# Patient Record
Sex: Male | Born: 2000 | Race: Black or African American | Hispanic: No | Marital: Single | Smoking: Never smoker
Health system: Southern US, Community
[De-identification: ages and names within clinical notes are randomized; demographics above are authoritative.]

## PROBLEM LIST (undated history)

## (undated) ENCOUNTER — Emergency Department (HOSPITAL_BASED_OUTPATIENT_CLINIC_OR_DEPARTMENT_OTHER): Disposition: A | Discharge status: Home Health | Payer: BLUE CROSS/BLUE SHIELD

## (undated) DIAGNOSIS — L0591 Pilonidal cyst without abscess: Secondary | ICD-10-CM

---
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
## 2018-04-27 ENCOUNTER — Encounter (HOSPITAL_COMMUNITY): Payer: Self-pay | Admitting: Emergency Medicine

## 2018-04-27 ENCOUNTER — Emergency Department (HOSPITAL_COMMUNITY)
Admission: EM | Admit: 2018-04-27 | Discharge: 2018-04-27 | Disposition: A | Discharge status: Home / Self Care | Payer: BLUE CROSS/BLUE SHIELD | Attending: Emergency Medicine | Admitting: Emergency Medicine

## 2018-04-27 DIAGNOSIS — L0231 Cutaneous abscess of buttock: Secondary | ICD-10-CM | POA: Diagnosis present

## 2018-04-27 MED ORDER — ACETAMINOPHEN 325 MG PO TABS
650.0000 mg | ORAL_TABLET | Freq: Once | ORAL | Status: AC
  Administered 2018-04-27: 650 mg via ORAL
  Filled 2018-04-27: qty 2

## 2018-04-27 MED ORDER — LIDOCAINE-EPINEPHRINE (PF) 2 %-1:200000 IJ SOLN
20.0000 mL | Freq: Once | INTRAMUSCULAR | Status: AC
  Administered 2018-04-27: 20 mL via INTRADERMAL
  Filled 2018-04-27: qty 20

## 2018-04-27 NOTE — ED Triage Notes (Signed)
Pt presents to ED for assessment of pilonidal cyst, which he has been seen for in the past.  States he completed Abx and there was improvement, so he did not follow up with the general surgeon.  Back today due to increased swelling, pain, and fevers at home.

## 2018-04-27 NOTE — Discharge Instructions (Signed)
You can take Tylenol or ibuprofen for pain Keep wound clean with warm soap and water and keep bandage dry, do not submerge in water for 24 hours. Change bandage sooner if it gets dirty Return for ongoing fevers, increased redness, swelling, pain, or worsening drainage

## 2018-04-27 NOTE — ED Provider Notes (Signed)
Patient placed in Quick Look pathway, seen and evaluated   Chief Complaint: abscess  HPI: Vance Hochmuth is a 17 y.o. male who presents to the ED with c/o abscess. Patient reports he had same problem 3 or 4 months ago and had the area drained, took antibiotics and was given a referral but it was better so he did not make an appointment. Patient reports today he felt like he had fever and the pain got so bad he felt nauseated.   ROS: Skin: abscess  Physical Exam:  BP 124/73   Pulse (!) 108   Temp 100.1 F (37.8 C) (Oral)   Resp 18   SpO2 100%    Gen: No distress, low grade fever  Neuro: Awake and Alert  Resp: no distress  Heart: tachycardia  Initiation of care has begun. The patient has been counseled on the process, plan, and necessity for staying for the completion/evaluation, and the remainder of the medical screening examination    Janne Napoleon, NP 04/27/18 1650    Raeford Razor, MD 04/28/18 1721

## 2018-04-27 NOTE — ED Provider Notes (Signed)
[facility] EMERGENCY DEPARTMENT Provider Note   CSN: [id number] Arrival date & time: 04/27/18  1628     History   Chief Complaint Chief Complaint  Patient presents with  . Abscess    HPI Wesley Dorsey is a 17 y.o. male who presents with a buttocks abscess. PMH significant for pilonidal cyst. He states that he's had a gradually worsening area of redness and swelling over the left buttock for the past 5-6 days. It has not been draining and has been getting worse so he decided to come to the ED. He required I&D of the pilonidal cyst last time he was in the ED. He reports having a fever today. No vomiting but feels nauseous. He is having normal BM which are not painful.  HPI  History reviewed. No pertinent past medical history.  There are no active problems to display for this patient.   History reviewed. No pertinent surgical history.      Home Medications    Prior to Admission medications   Medication Sig Start Date End Date Taking? Authorizing Provider  HYDROcodone-acetaminophen (NORCO) 5-325 MG tablet Take 1 tablet by mouth every 6 (six) hours as needed for severe pain. 12/23/17   Street, [place], PA-C  naproxen (NAPROSYN) 500 MG tablet Take 1 tablet (500 mg total) by mouth 2 (two) times daily as needed for mild pain, moderate pain or headache (TAKE WITH MEALS.). 12/23/17   Street, [place], PA-C  ondansetron (ZOFRAN ODT) 4 MG disintegrating tablet Take 1 tablet (4 mg total) by mouth every 8 (eight) hours as needed for nausea or vomiting. 12/23/17   Street, [place], PA-C    Family History History reviewed. No pertinent family history.  Social History Social History   Tobacco Use  . Smoking status: Never Smoker  . Smokeless tobacco: Never Used  Substance Use Topics  . Alcohol use: Never    Frequency: Never  . Drug use: Never     Allergies   Patient has no known allergies.   Review of Systems Review of Systems   Physical Exam Updated  Vital Signs BP 124/73   Pulse (!) 108   Temp 100.1 F (37.8 C) (Oral)   Resp 18   SpO2 100%   Physical Exam  Constitutional: He is oriented to person, place, and time. He appears well-developed and well-nourished. No distress.  HENT:  Head: Normocephalic and atraumatic.  Eyes: Pupils are equal, round, and reactive to light. Conjunctivae are normal. Right eye exhibits no discharge. Left eye exhibits no discharge. No scleral icterus.  Neck: Normal range of motion.  Cardiovascular: Normal rate.  Pulmonary/Chest: Effort normal. No respiratory distress.  Abdominal: He exhibits no distension.  Genitourinary:  Genitourinary Comments: 3x3cm abscess over the left buttock. No communication with rectum  Neurological: He is alert and oriented to person, place, and time.  Skin: Skin is warm and dry.  Psychiatric: He has a normal mood and affect. His behavior is normal.  Nursing note and vitals reviewed.    ED Treatments / Results  Labs (all labs ordered are listed, but only abnormal results are displayed) Labs Reviewed - No data to display  EKG None  Radiology No results found.  Procedures .Marland KitchenIncision and Drainage Date/Time: 04/27/2018 8:54 PM Performed by: Bethel Born, PA-C Authorized by: Bethel Born, PA-C   Consent:    Consent obtained:  Verbal   Consent given by:  Patient   Risks discussed:  Bleeding, incomplete drainage and pain   Alternatives discussed:  No treatment Location:    Type:  Abscess   Size:  3x3cm   Location:  Anogenital   Anogenital location: left buttocks. Pre-procedure details:    Skin preparation:  Betadine Anesthesia (see MAR for exact dosages):    Anesthesia method:  Local infiltration   Local anesthetic:  Lidocaine 2% WITH epi Procedure type:    Complexity:  Simple Procedure details:    Incision types:  Single straight   Incision depth:  Dermal   Scalpel blade:  11   Wound management:  Probed and deloculated and irrigated with  saline   Drainage:  Purulent   Drainage amount:  Copious   Wound treatment:  Wound left open   Packing materials:  None Post-procedure details:    Patient tolerance of procedure:  Tolerated well, no immediate complications   (including critical care time)    Medications Ordered in ED Medications  lidocaine-EPINEPHrine (XYLOCAINE W/EPI) 2 %-1:200000 (PF) injection 20 mL (has no administration in time range)     Initial Impression / Assessment and Plan / ED Course  I have reviewed the triage vital signs and the nursing notes.  Pertinent labs & imaging results that were available during my care of the patient were reviewed by me and considered in my medical decision making (see chart for details).  17 year old presents with a simple abscess which is amenable to I&D. I&D performed and patient tolerated well. Pt has elevated temp and HR. This improved on recheck after Tylenol. Discussed wound care and signs of infection (ongoing fever, chills, increasing pain, redness, or drainage at site). Strict return precautions given.  Final Clinical Impressions(s) / ED Diagnoses   Final diagnoses:  Abscess of buttock    ED Discharge Orders    None       Beryle Quant 04/27/18 2057    Donnetta Hutching, MD 04/28/18 [phone]

## 2018-04-29 ENCOUNTER — Emergency Department (HOSPITAL_BASED_OUTPATIENT_CLINIC_OR_DEPARTMENT_OTHER): Payer: BLUE CROSS/BLUE SHIELD

## 2018-04-29 ENCOUNTER — Encounter (HOSPITAL_BASED_OUTPATIENT_CLINIC_OR_DEPARTMENT_OTHER): Payer: Self-pay | Admitting: Emergency Medicine

## 2018-04-29 ENCOUNTER — Other Ambulatory Visit: Payer: Self-pay

## 2018-04-29 ENCOUNTER — Emergency Department (HOSPITAL_BASED_OUTPATIENT_CLINIC_OR_DEPARTMENT_OTHER)
Admission: EM | Admit: 2018-04-29 | Discharge: 2018-04-29 | Disposition: A | Discharge status: Home / Self Care | Payer: BLUE CROSS/BLUE SHIELD | Attending: Emergency Medicine | Admitting: Emergency Medicine

## 2018-04-29 DIAGNOSIS — Y929 Unspecified place or not applicable: Secondary | ICD-10-CM | POA: Diagnosis not present

## 2018-04-29 DIAGNOSIS — S0990XA Unspecified injury of head, initial encounter: Secondary | ICD-10-CM | POA: Diagnosis present

## 2018-04-29 DIAGNOSIS — S0181XA Laceration without foreign body of other part of head, initial encounter: Secondary | ICD-10-CM | POA: Diagnosis not present

## 2018-04-29 DIAGNOSIS — R42 Dizziness and giddiness: Secondary | ICD-10-CM | POA: Diagnosis not present

## 2018-04-29 DIAGNOSIS — Y9389 Activity, other specified: Secondary | ICD-10-CM | POA: Diagnosis not present

## 2018-04-29 DIAGNOSIS — Y998 Other external cause status: Secondary | ICD-10-CM | POA: Diagnosis not present

## 2018-04-29 DIAGNOSIS — R51 Headache: Secondary | ICD-10-CM | POA: Diagnosis not present

## 2018-04-29 MED ORDER — LIDOCAINE-EPINEPHRINE (PF) 2 %-1:200000 IJ SOLN
10.0000 mL | Freq: Once | INTRAMUSCULAR | Status: AC
  Administered 2018-04-29: 10 mL
  Filled 2018-04-29 (×2): qty 10

## 2018-04-29 MED ORDER — CEPHALEXIN 500 MG PO CAPS: 500.0000 mg | ORAL_CAPSULE | Freq: Three times a day (TID) | ORAL | 0 refills | Status: AC
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
## 2018-04-29 NOTE — ED Notes (Signed)
Patient transported to CT 

## 2018-04-29 NOTE — Discharge Instructions (Addendum)
Please read instructions below. Keep your wound clean and covered. In 24 hours, you can get your wound wet; gently clean it with soap and water, pat it dry, and reapply a clean bandage. Take the antibiotic as prescribed until it is gone. You can take ibuprofen/advil every 6 hours as needed for pain Limit screen time and complex thinking for the next week.  Avoid any contact sports/activities, until cleared by your primary care provider. Follow up with your primary care or urgent care for wound recheck and suture removal in 5 days.  Return to the ER for fever, pus draining from wound, redness, severely worsening headache, vision changes, vomiting, or new or worsening symptoms.
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
## 2018-08-03 ENCOUNTER — Emergency Department (HOSPITAL_BASED_OUTPATIENT_CLINIC_OR_DEPARTMENT_OTHER)
Admission: EM | Admit: 2018-08-03 | Discharge: 2018-08-03 | Disposition: A | Discharge status: Home / Self Care | Payer: BLUE CROSS/BLUE SHIELD | Attending: Emergency Medicine | Admitting: Emergency Medicine

## 2018-08-03 ENCOUNTER — Other Ambulatory Visit: Payer: Self-pay

## 2018-08-03 ENCOUNTER — Encounter (HOSPITAL_BASED_OUTPATIENT_CLINIC_OR_DEPARTMENT_OTHER): Payer: Self-pay | Admitting: *Deleted

## 2018-08-03 ENCOUNTER — Emergency Department (HOSPITAL_BASED_OUTPATIENT_CLINIC_OR_DEPARTMENT_OTHER): Payer: BLUE CROSS/BLUE SHIELD

## 2018-08-03 DIAGNOSIS — M25561 Pain in right knee: Secondary | ICD-10-CM | POA: Diagnosis present

## 2018-08-03 NOTE — Discharge Instructions (Signed)
Your workup was reassuring today that you do not have a bony injury or dislocation to your knee.  We recommend that you use the provided Ace wrap and crutches as needed, but you can continue to bear weight as tolerated.  Read through the included information about routine care for injuries.  Follow up as recommended if you are still having problems in about a week.   

## 2018-08-03 NOTE — ED Provider Notes (Signed)
Emergency Department Provider Note   I have reviewed the triage vital signs and the nursing notes.   HISTORY  Chief Complaint Knee Injury   HPI Wesley Dorsey is a 18 y.o. male presents to the emergency department for evaluation of right knee pain.  Patient has had 2 days of discomfort.  He suspects that he injured the knee during a basketball game.  He states he was playing when he feel like his knee bent backwards causing him some discomfort.  He states he was able to "walk it off" and kept playing.  Later in the game he felt like his knee bent to the side.  Denies any twisting.  No pain in the ankle or hip.  He has been ambulatory since the injury.  He has a knee sleeve in place but continues to have discomfort so came to the emergency department.  No numbness or tingling.  History reviewed. No pertinent past medical history.  There are no active problems to display for this patient.   History reviewed. No pertinent surgical history.  Current Outpatient Rx  . Order #: [id number] Class: Print    Allergies Patient has no known allergies.  History reviewed. No pertinent family history.  Social History Social History   Tobacco Use  . Smoking status: Never Smoker  . Smokeless tobacco: Never Used  Substance Use Topics  . Alcohol use: Never    Frequency: Never  . Drug use: Never    Review of Systems  Constitutional: No fever/chills Genitourinary: Negative for dysuria. Musculoskeletal: Negative for back pain. Positive right knee pain.  Skin: Negative for rash. Neurological: Negative for headaches.  10-point ROS otherwise negative.  ____________________________________________   PHYSICAL EXAM:  VITAL SIGNS: ED Triage Vitals  Enc Vitals Group     BP 08/03/18 1801 118/61     Pulse Rate 08/03/18 1801 85     Resp 08/03/18 1801 18     Temp 08/03/18 1801 98.5 F (36.9 C)     Temp Source 08/03/18 1801 Oral     SpO2 08/03/18 1801 100 %     Weight 08/03/18 1801  205 lb (93 kg)     Height 08/03/18 1801 5\' 10"  (1.778 m)     Pain Score 08/03/18 1800 7   Constitutional: Alert and oriented. Well appearing and in no acute distress. Eyes: Conjunctivae are normal.  Head: Atraumatic. Nose: No congestion/rhinnorhea. Mouth/Throat: Mucous membranes are moist. Neck: No stridor.  Cardiovascular: Normal rate, regular rhythm.  Respiratory: Normal respiratory effort. Gastrointestinal: No distention.  Musculoskeletal: Mild pain with passive ROM of the right knee. No ankle tenderness. No significant knee swelling. No laceration. No joint laxity.  Neurologic:  Normal speech and language.  Skin:  Skin is warm, dry and intact. No rash noted.  ____________________________________________  RADIOLOGY  Dg Knee Complete 4 Views Right  Result Date: 08/03/2018 CLINICAL DATA:  Medial right knee pain following a twisting injury 2 days ago. EXAM: RIGHT KNEE - COMPLETE 4+ VIEW COMPARISON:  None. FINDINGS: Moderate-sized effusion. Normal appearing bones with no fracture or dislocation seen. IMPRESSION: Moderate-sized effusion.  No fracture or dislocation seen. Electronically Signed   By: Beckie Salts M.D.   On: 08/03/2018 18:33    ____________________________________________   PROCEDURES  Procedure(s) performed:   Procedures  None ____________________________________________   INITIAL IMPRESSION / ASSESSMENT AND PLAN / ED COURSE  Pertinent labs & imaging results that were available during my care of the patient were reviewed by me and considered in my medical  decision making (see chart for details).  She presents to the emergency department for evaluation of right knee pain after injury during basketball 2 days ago.  He has been doing RICE at home but continue to have pain with walking.  No concern for knee dislocation at the time of injury based on history.  Patient with normal range of motion of the knee and no joint laxity.   Moderate effusion on x-ray.  No  bony abnormality.  Patient reports having crutches fitted to him at home.  He will use that along with the compressive knee sleeve.  Provided information on management of symptoms at home and he will follow-up with sports medicine in the coming 1-2 weeks if symptoms persist.  ____________________________________________  FINAL CLINICAL IMPRESSION(S) / ED DIAGNOSES  Final diagnoses:  Acute pain of right knee    Note:  This document was prepared using Dragon voice recognition software and may include unintentional dictation errors.  Alona Bene, MD Emergency Medicine    Jasha Hodzic, Arlyss Repress, MD 08/03/18 [phone]

## 2018-08-03 NOTE — ED Triage Notes (Signed)
Pt c/o right knee injury while playing ball x 2 days ago

## 2018-08-03 NOTE — ED Notes (Signed)
Pt took aspirin, ibuprofen, and tylenol approx 11am.

## 2018-08-09 ENCOUNTER — Ambulatory Visit: Payer: BLUE CROSS/BLUE SHIELD | Admitting: Family Medicine

## 2018-08-15 ENCOUNTER — Ambulatory Visit: Payer: BLUE CROSS/BLUE SHIELD | Admitting: Family Medicine

## 2018-10-17 ENCOUNTER — Emergency Department (HOSPITAL_COMMUNITY)
Admission: EM | Admit: 2018-10-17 | Discharge: 2018-10-17 | Disposition: A | Discharge status: Home / Self Care | Payer: BLUE CROSS/BLUE SHIELD | Attending: Emergency Medicine | Admitting: Emergency Medicine

## 2018-10-17 ENCOUNTER — Other Ambulatory Visit: Payer: Self-pay

## 2018-10-17 ENCOUNTER — Encounter (HOSPITAL_COMMUNITY): Payer: Self-pay | Admitting: Emergency Medicine

## 2018-10-17 DIAGNOSIS — L0501 Pilonidal cyst with abscess: Secondary | ICD-10-CM | POA: Insufficient documentation

## 2018-10-17 DIAGNOSIS — L0291 Cutaneous abscess, unspecified: Secondary | ICD-10-CM

## 2018-10-17 MED ORDER — SULFAMETHOXAZOLE-TRIMETHOPRIM 800-160 MG PO TABS: 1.0000 | ORAL_TABLET | Freq: Two times a day (BID) | ORAL | 0 refills | Status: DC

## 2018-10-17 MED ORDER — LIDOCAINE HCL (PF) 1 % IJ SOLN
5.0000 mL | Freq: Once | INTRAMUSCULAR | Status: AC
  Administered 2018-10-17: 10:00:00 5 mL
  Filled 2018-10-17: qty 5

## 2018-10-17 NOTE — ED Provider Notes (Signed)
[facility] EMERGENCY DEPARTMENT Provider Note   CSN: [id number] Arrival date & time: 10/17/18  4431    History   Chief Complaint Chief Complaint  Patient presents with  . Abscess    HPI Wesley Dorsey is a 18 y.o. male.     18yo male with history of pilonidal abscess with previous I&D presents with complaint of abscess x 3 days (pain, no drainage), no home treatment. Denies fevers, abdominal pain, changes in bowel or bladder habits. No other complaints or concerns.      History reviewed. No pertinent past medical history.  There are no active problems to display for this patient.   History reviewed. No pertinent surgical history.      Home Medications    Prior to Admission medications   Medication Sig Start Date End Date Taking? Authorizing Provider  naproxen (NAPROSYN) 500 MG tablet Take 1 tablet (500 mg total) by mouth 2 (two) times daily as needed for mild pain, moderate pain or headache (TAKE WITH MEALS.). 12/23/17   Street, Mercedes, PA-C  sulfamethoxazole-trimethoprim (BACTRIM DS,SEPTRA DS) 800-160 MG tablet Take 1 tablet by mouth 2 (two) times daily for 10 days. 10/17/18 10/27/18  Jeannie Fend, PA-C    Family History No family history on file.  Social History Social History   Tobacco Use  . Smoking status: Never Smoker  . Smokeless tobacco: Never Used  Substance Use Topics  . Alcohol use: Never    Frequency: Never  . Drug use: Never     Allergies   Patient has no known allergies.   Review of Systems Review of Systems  Constitutional: Negative for fever.  Gastrointestinal: Negative for rectal pain.  Musculoskeletal: Negative for back pain.  Skin: Negative for wound.  Allergic/Immunologic: Negative for immunocompromised state.  Hematological: Negative for adenopathy.  All other systems reviewed and are negative.    Physical Exam Updated Vital Signs BP 105/60 (BP Location: Left Arm)   Pulse 83   Temp 98.6 F (37 C)  (Oral)   Resp 16   Ht 5\' 10"  (1.778 m)   Wt 93 kg   SpO2 100%   BMI 29.41 kg/m   Physical Exam Vitals signs and nursing note reviewed.  Constitutional:      General: He is not in acute distress.    Appearance: He is well-developed. He is not diaphoretic.  HENT:     Head: Normocephalic and atraumatic.  Pulmonary:     Effort: Pulmonary effort is normal.  Skin:    General: Skin is warm and dry.     Findings: Erythema present.       Neurological:     Mental Status: He is alert and oriented to person, place, and time.  Psychiatric:        Behavior: Behavior normal.      ED Treatments / Results  Labs (all labs ordered are listed, but only abnormal results are displayed) Labs Reviewed - No data to display  EKG None  Radiology No results found.  Procedures Procedures (including critical care time)  EMERGENCY DEPARTMENT US SOFT TISSUE INTERPRETATION "Study: Limited Soft Tissue Ultrasound"  INDICATIONS: Soft tissue infection Multiple views of the body part were obtained in real-time with a multi-frequency linear probe  PERFORMED BY: Myself IMAGES ARCHIVED?: Yes SIDE:Midline BODY PART:buttock INTERPRETATION:  Abcess present       Medications Ordered in ED Medications  lidocaine (PF) (XYLOCAINE) 1 % injection 5 mL (5 mLs Infiltration Given 10/17/18 1011)  Initial Impression / Assessment and Plan / ED Course  I have reviewed the triage vital signs and the nursing notes.  Pertinent labs & imaging results that were available during my care of the patient were reviewed by me and considered in my medical decision making (see chart for details).  Clinical Course as of Oct 17 1054  Wed Oct 17, 2018  1032 18yo male with previous pilonidal abscess present with concern for return of abscess. No fevers, no drainage. On exam, small area of possible abscess, US used to evaluate area and shows likely abscess. I&D completed, bloody, nonpurulent drainage present.  Packing was not placed. Recommend abx and warm compresses, recheck in ER or UC in 2 days. Patient is agreeable with plan of care, verbalizes understanding. Dc with rx for Bactrim.   Wesley Dorsey was evaluated in Emergency Department on 10/17/2018 for the symptoms described in the history of present illness. He was evaluated in the context of the global COVID-19 pandemic, which necessitated consideration that the patient might be at risk for infection with the SARS-CoV-2 virus that causes COVID-19. Institutional protocols and algorithms that pertain to the evaluation of patients at risk for COVID-19 are in a state of rapid change based on information released by regulatory bodies including the CDC and federal and state organizations. These policies and algorithms were followed during the patient's care in the ED.     [LM]    Clinical Course User Index [LM] Jeannie FendMurphy,  A, PA-C      Final Clinical Impressions(s) / ED Diagnoses   Final diagnoses:  Abscess    ED Discharge Orders         Ordered    sulfamethoxazole-trimethoprim (BACTRIM DS,SEPTRA DS) 800-160 MG tablet  2 times daily     10/17/18 1029           Alden HippMurphy,  A, PA-C 10/17/18 1059    Vanetta MuldersZackowski, Scott, MD 10/20/18 1342

## 2018-10-17 NOTE — Discharge Instructions (Addendum)
Take antibiotics as prescribed and complete the full course. Return to the ER or go to UC for any concerns at any time.

## 2018-10-17 NOTE — ED Triage Notes (Signed)
Pt. Stated, I have an abscess right on the crack of my butt for 3 days.

## 2018-10-17 NOTE — ED Provider Notes (Signed)
..  Incision and Drainage Date/Time: 10/17/2018 10:46 AM Performed by: Tanda Rockers, PA-C Authorized by: Tanda Rockers, PA-C   Consent:    Consent obtained:  Verbal   Consent given by:  Patient   Risks discussed:  Bleeding, incomplete drainage, pain and infection Location:    Type:  Abscess   Size:  2 cm   Location:  Anogenital   Anogenital location:  Pilonidal Pre-procedure details:    Skin preparation:  Betadine Anesthesia (see MAR for exact dosages):    Anesthesia method:  Local infiltration   Local anesthetic:  Lidocaine 1% w/o epi Procedure type:    Complexity:  Simple Procedure details:    Needle aspiration: no     Incision types:  Single straight   Incision depth:  Submucosal   Scalpel blade:  10   Wound management:  Probed and deloculated   Drainage:  Bloody   Drainage amount:  Scant   Wound treatment:  Wound left open   Packing materials:  None Post-procedure details:    Patient tolerance of procedure:  Tolerated well, no immediate complications      Tanda Rockers, PA-C 10/17/18 1048    Sabas Sous, MD 10/17/18 1534

## 2018-10-17 NOTE — ED Notes (Signed)
Patient verbalizes understanding of discharge instructions. Opportunity for questioning and answers were provided. Armband removed by staff, pt discharged from ED. Prescription and pharmacy reviewed. Pt ambulatory to lobby.

## 2018-10-19 ENCOUNTER — Other Ambulatory Visit: Payer: Self-pay

## 2018-10-19 ENCOUNTER — Encounter (HOSPITAL_BASED_OUTPATIENT_CLINIC_OR_DEPARTMENT_OTHER): Payer: Self-pay | Admitting: Emergency Medicine

## 2018-10-19 ENCOUNTER — Emergency Department (HOSPITAL_BASED_OUTPATIENT_CLINIC_OR_DEPARTMENT_OTHER)
Admission: EM | Admit: 2018-10-19 | Discharge: 2018-10-19 | Disposition: A | Discharge status: Home / Self Care | Payer: BLUE CROSS/BLUE SHIELD | Attending: Emergency Medicine | Admitting: Emergency Medicine

## 2018-10-19 DIAGNOSIS — L0501 Pilonidal cyst with abscess: Secondary | ICD-10-CM | POA: Diagnosis not present

## 2018-10-19 DIAGNOSIS — R509 Fever, unspecified: Secondary | ICD-10-CM | POA: Diagnosis not present

## 2018-10-19 HISTORY — DX: Pilonidal cyst without abscess: L05.91

## 2018-10-19 MED ORDER — DOXYCYCLINE HYCLATE 100 MG PO CAPS: 100.0000 mg | ORAL_CAPSULE | Freq: Two times a day (BID) | ORAL | 0 refills | Status: AC

## 2018-10-19 MED ORDER — LIDOCAINE-EPINEPHRINE 2 %-1:200000 IJ SOLN
10.0000 mL | Freq: Once | INTRAMUSCULAR | Status: AC
Start: 2018-10-19 — End: 2018-10-19
  Administered 2018-10-19: 09:00:00 10 mL
  Filled 2018-10-19: qty 10

## 2018-10-19 MED ORDER — LIDOCAINE-EPINEPHRINE (PF) 2 %-1:200000 IJ SOLN
INTRAMUSCULAR | Status: AC
  Administered 2018-10-19: 10 mL
  Filled 2018-10-19: qty 10

## 2018-10-19 NOTE — Discharge Instructions (Signed)
You have been seen in the Emergency Department (ED) today for an abscess.  This was drained in the ED. ° °Please follow up with your doctor or in the ED in 24-48 hours for recheck of your wound.  Read through the additional discharge instructions included below regarding wound care recommendations.  Keep the wound clean and dry, though you may wash as you would normally.  Change the dressing twice daily. ° °Call your doctor sooner or return to the ED if you develop worsening signs of infection such as: increased redness, increased pain, pus, or fever. ° ° °Abscess °An abscess is an infected area that contains a collection of pus and debris. It can occur in almost any part of the body. An abscess is also known as a furuncle or boil. °CAUSES  °An abscess occurs when tissue gets infected. This can occur from blockage of oil or sweat glands, infection of hair follicles, or a minor injury to the skin. As the body tries to fight the infection, pus collects in the area and creates pressure under the skin. This pressure causes pain. People with weakened immune systems have difficulty fighting infections and get certain abscesses more often.  °SYMPTOMS °Usually an abscess develops on the skin and becomes a painful mass that is red, warm, and tender. If the abscess forms under the skin, you may feel a moveable soft area under the skin. Some abscesses break open (rupture) on their own, but most will continue to get worse without care. The infection can spread deeper into the body and eventually into the bloodstream, causing you to feel ill.  °DIAGNOSIS  °Your caregiver will take your medical history and perform a physical exam. A sample of fluid may also be taken from the abscess to determine what is causing your infection. °TREATMENT  °Your caregiver may prescribe antibiotic medicines to fight the infection. However, taking antibiotics alone usually does not cure an abscess. Your caregiver may need to make a small cut  (incision) in the abscess to drain the pus. In some cases, gauze is packed into the abscess to reduce pain and to continue draining the area. °HOME CARE INSTRUCTIONS  °Only take over-the-counter or prescription medicines for pain, discomfort, or fever as directed by your caregiver. °If you were prescribed antibiotics, take them as directed. Finish them even if you start to feel better. °If gauze is used, follow your caregiver's directions for changing the gauze. °To avoid spreading the infection: °Keep your draining abscess covered with a bandage. °Wash your hands well. °Do not share personal care items, towels, or whirlpools with others. °Avoid skin contact with others. °Keep your skin and clothes clean around the abscess. °Keep all follow-up appointments as directed by your caregiver. °SEEK MEDICAL CARE IF:  °You have increased pain, swelling, redness, fluid drainage, or bleeding. °You have muscle aches, chills, or a general ill feeling. °You have a fever. °MAKE SURE YOU:  °Understand these instructions. °Will watch your condition. °Will get help right away if you are not doing well or get worse. °Document Released: 03/30/2005 Document Revised: 12/20/2011 Document Reviewed: 09/02/2011 °Exit[employer]® Patient Information ©2015 [employer]. This information is not intended to replace advice given to you by your health care provider. Make sure you discuss any questions you have with your health care provider. ° °Abscess °Care After °An abscess (also called a boil or furuncle) is an infected area that contains a collection of pus. Signs and symptoms of an abscess include pain, tenderness, redness, or hardness,   or you may feel a moveable soft area under your skin. An abscess can occur anywhere in the body. The infection may spread to surrounding tissues causing cellulitis. A cut (incision) by the surgeon was made over your abscess and the pus was drained out. Gauze may have been packed into the space to provide a drain  that will allow the cavity to heal from the inside outwards. The boil may be painful for 5 to 7 days. Most people with a boil do not have high fevers. Your abscess, if seen early, may not have localized, and may not have been lanced. If not, another appointment may be required for this if it does not get better on its own or with medications. °HOME CARE INSTRUCTIONS  °Only take over-the-counter or prescription medicines for pain, discomfort, or fever as directed by your caregiver. °When you bathe, soak and then remove gauze or iodoform packs at least daily or as directed by your caregiver. You may then wash the wound gently with mild soapy water. Repack with gauze or do as your caregiver directs. °SEEK IMMEDIATE MEDICAL CARE IF:  °You develop increased pain, swelling, redness, drainage, or bleeding in the wound site. °You develop signs of generalized infection including muscle aches, chills, fever, or a general ill feeling. °An oral temperature above 102° F (38.9° C) develops, not controlled by medication. °See your caregiver for a recheck if you develop any of the symptoms described above. If medications (antibiotics) were prescribed, take them as directed. °Document Released: 01/06/2005 Document Revised: 09/12/2011 Document Reviewed: 09/03/2007 °ExitCare® Patient Information ©2015 [employer]. This information is not intended to replace advice given to you by your health care provider. Make sure you discuss any questions you have with your health care provider. ° °Cellulitis °Cellulitis is an infection of the skin and the tissue beneath it. The infected area is usually red and tender. Cellulitis occurs most often in the arms and lower legs.  °CAUSES  °Cellulitis is caused by bacteria that enter the skin through cracks or cuts in the skin. The most common types of bacteria that cause cellulitis are staphylococci and streptococci. °SIGNS AND SYMPTOMS  °Redness and warmth. °Swelling. °Tenderness or  pain. °Fever. °DIAGNOSIS  °Your health care provider can usually determine what is wrong based on a physical exam. Blood tests may also be done. °TREATMENT  °Treatment usually involves taking an antibiotic medicine. °HOME CARE INSTRUCTIONS  °Take your antibiotic medicine as directed by your health care provider. Finish the antibiotic even if you start to feel better. °Keep the infected arm or leg elevated to reduce swelling. °Apply a warm cloth to the affected area up to 4 times per day to relieve pain. °Take medicines only as directed by your health care provider. °Keep all follow-up visits as directed by your health care provider. °SEEK MEDICAL CARE IF:  °You notice red streaks coming from the infected area. °Your red area gets larger or turns dark in color. °Your bone or joint underneath the infected area becomes painful after the skin has healed. °Your infection returns in the same area or another area. °You notice a swollen bump in the infected area. °You develop new symptoms. °You have a fever. °SEEK IMMEDIATE MEDICAL CARE IF:  °You feel very sleepy. °You develop vomiting or diarrhea. °You have a general ill feeling (malaise) with muscle aches and pains. °MAKE SURE YOU:  °Understand these instructions. °Will watch your condition. °Will get help right away if you are not doing well or get   worse. °Document Released: 03/30/2005 Document Revised: 11/04/2013 Document Reviewed: 09/05/2011 °ExitCare® Patient Information ©2015 [employer]. This information is not intended to replace advice given to you by your health care provider. Make sure you discuss any questions you have with your health care provider. ° ° ° °

## 2018-10-19 NOTE — ED Triage Notes (Signed)
Hx of Pilonidal cyst and pt sts it started flaring up again 5 days ago.  Was at [facility] ED yesterday and received rx for abx.  Area is worse. Has had to have it drained in the past.

## 2018-10-19 NOTE — ED Notes (Signed)
ED Provider at bedside. 

## 2018-10-19 NOTE — ED Provider Notes (Signed)
Emergency Department Provider Note   I have reviewed the triage vital signs and the nursing notes.   HISTORY  Chief Complaint Abscess   HPI Wesley Dorsey is a 18 y.o. male with PMH of pilonidal cyst returns to the emergency department with worsening pain in the area of a recently drained abscess  with fever at home.  Patient reports T-max of 17 F yesterday.  No shaking chills, body aches.  Patient had incision and drainage at that time with scant purulent material noted on procedure note.  Patient was prescribed Bactrim and states has been taking the medication but pain is worsening.  He began to have some drainage from the area last night after soaking in the bath.  He denies any respiratory symptoms.  He has had this area drained twice before and has attempted to call the general surgery clinic for follow-up appointment and cyst removal but due to COVID-19 pandemic the office has been closed.  Patient describes the pain as constant and worse with movement or pressing in the area.   Past Medical History:  Diagnosis Date  . Pilonidal cyst     There are no active problems to display for this patient.   History reviewed. No pertinent surgical history.  Allergies Patient has no known allergies.  No family history on file.  Social History Social History   Tobacco Use  . Smoking status: Never Smoker  . Smokeless tobacco: Never Used  Substance Use Topics  . Alcohol use: Never    Frequency: Never  . Drug use: Yes    Types: Marijuana    Comment: yesterday was last use    Review of Systems  Constitutional: Positive fever, no chills.  Eyes: No visual changes. ENT: No sore throat. Cardiovascular: Denies chest pain. Respiratory: Denies shortness of breath. Gastrointestinal: No abdominal pain.  No nausea, no vomiting.  No diarrhea.  No constipation. Genitourinary: Negative for dysuria. Musculoskeletal: Negative for back pain. Skin: Abscess near buttocks.   Neurological: Negative for headaches, focal weakness or numbness.  10-point ROS otherwise negative.  ____________________________________________   PHYSICAL EXAM:  VITAL SIGNS: ED Triage Vitals  Enc Vitals Group     BP 10/19/18 0905 122/77     Pulse Rate 10/19/18 0905 89     Resp 10/19/18 0905 16     Temp 10/19/18 0905 99.3 F (37.4 C)     Temp Source 10/19/18 0905 Oral     SpO2 10/19/18 0905 99 %     Weight 10/19/18 0906 204 lb 12.9 oz (92.9 kg)     Height 10/19/18 0906 5\' 10"  (1.778 m)     Pain Score 10/19/18 0906 10   Constitutional: Alert and oriented. Well appearing and in no acute distress. Eyes: Conjunctivae are normal.  Head: Atraumatic. Nose: No congestion/rhinnorhea. Mouth/Throat: Mucous membranes are moist.  Neck: No stridor.   Cardiovascular: Normal rate, regular rhythm. Good peripheral circulation. Grossly normal heart sounds.   Respiratory: Normal respiratory effort.  No retractions. Lungs CTAB. Gastrointestinal: Soft and nontender. No distention. Pilonidal area of swelling without surrounding cellulitis or induration. Area of recent incision is closed. Inferior to the fluctuant area there is a small skin opening with copious purulent drainage.  Musculoskeletal: No lower extremity tenderness nor edema. No gross deformities of extremities. Neurologic:  Normal speech and language. No gross focal neurologic deficits are appreciated.  Skin:  Skin is warm, dry and intact. No rash noted.  ____________________________________________   PROCEDURES  Procedure(s) performed:   Marland KitchenMarland KitchenIncision and  Drainage Date/Time: 10/19/2018 9:39 AM Performed by: Maia Plan,  G, MD Authorized by: Maia Plan,  G, MD   Consent:    Consent obtained:  Verbal   Consent given by:  Patient   Risks discussed:  Bleeding, damage to other organs, infection, incomplete drainage and pain   Alternatives discussed:  No treatment Location:    Type:  Abscess   Size:  4   Location:  Anogenital    Anogenital location:  Pilonidal Pre-procedure details:    Skin preparation:  Betadine Anesthesia (see MAR for exact dosages):    Anesthesia method:  Local infiltration   Local anesthetic:  Lidocaine 2% WITH epi Procedure type:    Complexity:  Complex Procedure details:    Needle aspiration: no     Incision types:  Single straight   Incision depth:  Subcutaneous   Scalpel blade:  11   Wound management:  Probed and deloculated   Drainage:  Purulent   Drainage amount:  Copious   Packing materials:  1/4 in iodoform gauze   Amount 1/4" iodoform:  15 cm approximately  Post-procedure details:    Patient tolerance of procedure:  Tolerated well, no immediate complications     ____________________________________________   INITIAL IMPRESSION / ASSESSMENT AND PLAN / ED COURSE  Pertinent labs & imaging results that were available during my care of the patient were reviewed by me and considered in my medical decision making (see chart for details).   Patient returns with worsening pain and fever at home.  There is a fluctuant area in the upper gluteal cleft.  No tenderness near the rectum.  No overlying cellulitis.  There is an area inferior to the fluctuant mass which is spontaneously draining copious purulent material.  I was able to express a significant amount of pus through this opening but given the amount I discussed with the patient extending the opening and placing packing.  He verbally consented to the procedure.  See associated procedure note.  Patient will need to return to the emergency department in 48 hours for wound reevaluation and packing removal.  Decision to replace packing to be made at that time.  I will switch the patient to doxycycline.  Discussed ED return precautions.  Patient given information regarding outpatient surgery for follow-up and removal of the pilonidal cyst but options for that are limited in the setting of COVID-19 and office closure.     ____________________________________________  FINAL CLINICAL IMPRESSION(S) / ED DIAGNOSES  Final diagnoses:  Pilonidal abscess     MEDICATIONS GIVEN DURING THIS VISIT:  Medications  lidocaine-EPINEPHrine (XYLOCAINE W/EPI) 2 %-1:200000 (PF) injection 10 mL (10 mLs Infiltration Given 10/19/18 0918)     NEW OUTPATIENT MEDICATIONS STARTED DURING THIS VISIT:  New Prescriptions   DOXYCYCLINE (VIBRAMYCIN) 100 MG CAPSULE    Take 1 capsule (100 mg total) by mouth 2 (two) times daily for 7 days.    Note:  This document was prepared using Dragon voice recognition software and may include unintentional dictation errors.  Alona Bene , MD Emergency Medicine    , Arlyss Repress G, MD 10/19/18 [phone]

## 2018-10-21 ENCOUNTER — Encounter (HOSPITAL_BASED_OUTPATIENT_CLINIC_OR_DEPARTMENT_OTHER): Payer: Self-pay | Admitting: Emergency Medicine

## 2018-10-21 ENCOUNTER — Other Ambulatory Visit: Payer: Self-pay

## 2018-10-21 ENCOUNTER — Emergency Department (HOSPITAL_BASED_OUTPATIENT_CLINIC_OR_DEPARTMENT_OTHER)
Admission: EM | Admit: 2018-10-21 | Discharge: 2018-10-21 | Disposition: A | Discharge status: Home / Self Care | Payer: BLUE CROSS/BLUE SHIELD | Attending: Emergency Medicine | Admitting: Emergency Medicine

## 2018-10-21 DIAGNOSIS — Z5189 Encounter for other specified aftercare: Secondary | ICD-10-CM

## 2018-10-21 DIAGNOSIS — L0501 Pilonidal cyst with abscess: Secondary | ICD-10-CM | POA: Insufficient documentation

## 2018-10-21 DIAGNOSIS — Z79899 Other long term (current) drug therapy: Secondary | ICD-10-CM | POA: Diagnosis not present

## 2018-10-21 DIAGNOSIS — Z4801 Encounter for change or removal of surgical wound dressing: Secondary | ICD-10-CM | POA: Diagnosis present

## 2018-10-21 NOTE — ED Provider Notes (Signed)
[facility] EMERGENCY DEPARTMENT Provider Note   CSN: [id number] Arrival date & time: 10/21/18  1025    History   Chief Complaint Chief Complaint  Patient presents with   Wound Check    HPI Ebony HailDarren Harlin HeysRollins is a 18 y.o. male.     18 year old male presents for abscess recheck.  Patient was last seen 2 days ago, had I&D with packing placed.  Patient has been taking his antibiotics. Patient denies fevers, reports ongoing bloody drainage. No other complaints or concerns.      Past Medical History:  Diagnosis Date   Pilonidal cyst     There are no active problems to display for this patient.   History reviewed. No pertinent surgical history.      Home Medications    Prior to Admission medications   Medication Sig Start Date End Date Taking? Authorizing Provider  doxycycline (VIBRAMYCIN) 100 MG capsule Take 1 capsule (100 mg total) by mouth 2 (two) times daily for 7 days. 10/19/18 10/26/18  Long, Arlyss RepressJoshua G, MD  naproxen (NAPROSYN) 500 MG tablet Take 1 tablet (500 mg total) by mouth 2 (two) times daily as needed for mild pain, moderate pain or headache (TAKE WITH MEALS.). 12/23/17   Street, Mission HillMercedes, PA-C    Family History No family history on file.  Social History Social History   Tobacco Use   Smoking status: Never Smoker   Smokeless tobacco: Never Used  Substance Use Topics   Alcohol use: Never    Frequency: Never   Drug use: Yes    Types: Marijuana    Comment: yesterday was last use     Allergies   Patient has no known allergies.   Review of Systems Review of Systems  Constitutional: Negative for fever.  Musculoskeletal: Negative for arthralgias and myalgias.  Allergic/Immunologic: Negative for immunocompromised state.  Hematological: Negative for adenopathy. Does not bruise/bleed easily.  All other systems reviewed and are negative.    Physical Exam Updated Vital Signs BP 121/79 (BP Location: Right Arm)    Pulse 99    Temp 98.3  F (36.8 C) (Oral)    Resp 16    Ht 5\' 10"  (1.778 m)    Wt 93 kg    SpO2 100%    BMI 29.41 kg/m   Physical Exam Vitals signs and nursing note reviewed.  Constitutional:      General: He is not in acute distress.    Appearance: He is well-developed. He is not diaphoretic.  HENT:     Head: Normocephalic and atraumatic.  Pulmonary:     Effort: Pulmonary effort is normal.  Skin:    General: Skin is warm and dry.     Findings: No erythema.       Neurological:     Mental Status: He is alert and oriented to person, place, and time.  Psychiatric:        Behavior: Behavior normal.      ED Treatments / Results  Labs (all labs ordered are listed, but only abnormal results are displayed) Labs Reviewed - No data to display  EKG None  Radiology No results found.  Procedures Procedures (including critical care time)  Medications Ordered in ED Medications - No data to display   Initial Impression / Assessment and Plan / ED Course  I have reviewed the triage vital signs and the nursing notes.  Pertinent labs & imaging results that were available during my care of the patient were reviewed by me and considered  in my medical decision making (see chart for details).  Clinical Course as of Oct 21 1050  Sun Oct 21, 2018  [id number] 18 year old male returns for packing removal 2 days after I&D of pilonidal cyst.  Moderate amount of purulent bloody drainage remains, additional packing was placed.  Patient to return to ER for packing removal and possible repacking 2 days.  Patient does have a number for general surgery and will call on Monday for an appointment.   [LM]    Clinical Course User Index [LM] Jeannie Fend, PA-C      Final Clinical Impressions(s) / ED Diagnoses   Final diagnoses:  Abscess re-check    ED Discharge Orders    None       Alden Hipp 10/21/18 1053    Shaune Pollack, MD 10/21/18 1109

## 2018-10-21 NOTE — ED Triage Notes (Signed)
Pt here for recheck of pilonidal cyst that was I & D'd 2 days ago

## 2018-10-21 NOTE — Discharge Instructions (Addendum)
Warm compresses for 30 minutes at a time.  Continue taking antibiotics.  Return for recheck in 2 days.

## 2018-11-07 ENCOUNTER — Telehealth: Payer: Self-pay

## 2018-11-07 NOTE — Telephone Encounter (Signed)
Tried contacting pt to schedule NP appt, pt did not answer no VM available phone hangs up after several rings

## 2018-11-07 NOTE — Telephone Encounter (Signed)
Okay to schedule NP appt virtually at his earliest convenience.

## 2018-11-07 NOTE — Telephone Encounter (Signed)
Copied from CRM [id number]. Topic: Appointment Scheduling - Scheduling Inquiry for Clinic >> Nov 07, 2018 10:08 AM Lorayne Bender wrote: Reason for CRM:   Pt called - states he has written instructions from [facility] to call and schedule OV with Dr. Drue Novel.  Tried calling office 3x. Pt can be reached at [phone]

## 2018-11-07 NOTE — Telephone Encounter (Signed)
Please advise if okay to schedule virtual visit- referred by MD in ED downstairs.

## 2018-11-07 NOTE — Telephone Encounter (Signed)
Okay to schedule.  Depending on the issue, he might need to come in person

## 2018-11-09 NOTE — Telephone Encounter (Signed)
Tried to call pt no answer.  I spoke with patient this week he said it wasn't a good time to make appt. Patient agreed to call back when it was a better time for him to talk.

## 2018-11-09 NOTE — Telephone Encounter (Signed)
Can we try calling Pt again? [phone].

## 2019-04-25 ENCOUNTER — Other Ambulatory Visit (HOSPITAL_COMMUNITY): Payer: Self-pay | Admitting: Orthopedic Surgery

## 2019-04-25 ENCOUNTER — Other Ambulatory Visit: Payer: Self-pay

## 2019-04-25 ENCOUNTER — Ambulatory Visit (HOSPITAL_COMMUNITY)
Admission: RE | Admit: 2019-04-25 | Discharge: 2019-04-25 | Disposition: A | Discharge status: Home / Self Care | Payer: BC Managed Care – PPO | Source: Ambulatory Visit | Attending: Orthopedic Surgery | Admitting: Orthopedic Surgery

## 2019-04-25 DIAGNOSIS — M79604 Pain in right leg: Secondary | ICD-10-CM | POA: Insufficient documentation

## 2019-04-25 DIAGNOSIS — M7989 Other specified soft tissue disorders: Secondary | ICD-10-CM | POA: Diagnosis present

## 2019-04-25 MED ORDER — RIVAROXABAN (XARELTO) VTE STARTER PACK (15 & 20 MG): ORAL_TABLET | ORAL | 0 refills | Status: AC

## 2019-04-25 NOTE — Progress Notes (Signed)
VASCULAR LAB PRELIMINARY  PRELIMINARY  PRELIMINARY  PRELIMINARY  Right lower extremity venous duplex completed.    Preliminary report:  See CV proc for preliminary results.  Attempted to call Dr. Mardelle Matte, left voicemail. 13:40 Awaiting return call.  Ihan Pat, RVT 04/25/2019, 1:46 PM

## 2019-06-18 ENCOUNTER — Other Ambulatory Visit: Payer: Self-pay

## 2019-06-18 DIAGNOSIS — M7989 Other specified soft tissue disorders: Secondary | ICD-10-CM

## 2019-06-18 DIAGNOSIS — M79604 Pain in right leg: Secondary | ICD-10-CM

## 2019-06-19 ENCOUNTER — Ambulatory Visit (HOSPITAL_COMMUNITY): Payer: BC Managed Care – PPO | Attending: Vascular Surgery

## 2019-06-19 ENCOUNTER — Ambulatory Visit: Payer: BC Managed Care – PPO | Admitting: Vascular Surgery

## 2019-07-05 DEATH — deceased

## 2019-08-07 ENCOUNTER — Encounter: Payer: Self-pay | Admitting: Surgery

## 2020-04-21 IMAGING — CR DG ANKLE COMPLETE 3+V*R*
3 series · 3 of 3 positions shown · non-contrast
Comparison: None.

CLINICAL DATA: Swelling

EXAM:
RIGHT ANKLE - COMPLETE 3+ VIEW

[t ankle joint ap right]
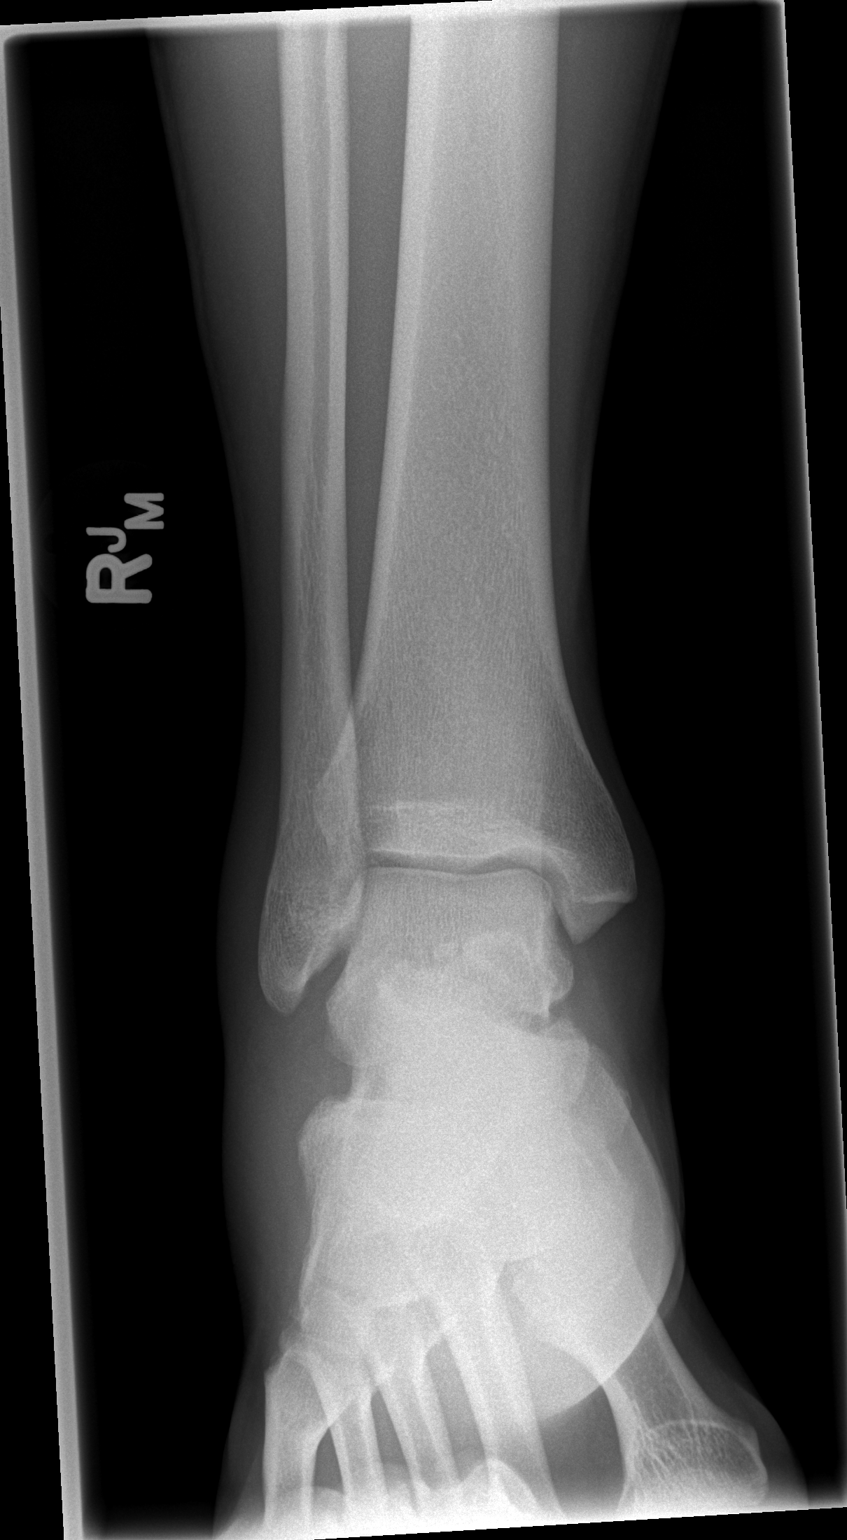

[t ankle joint oblique right]
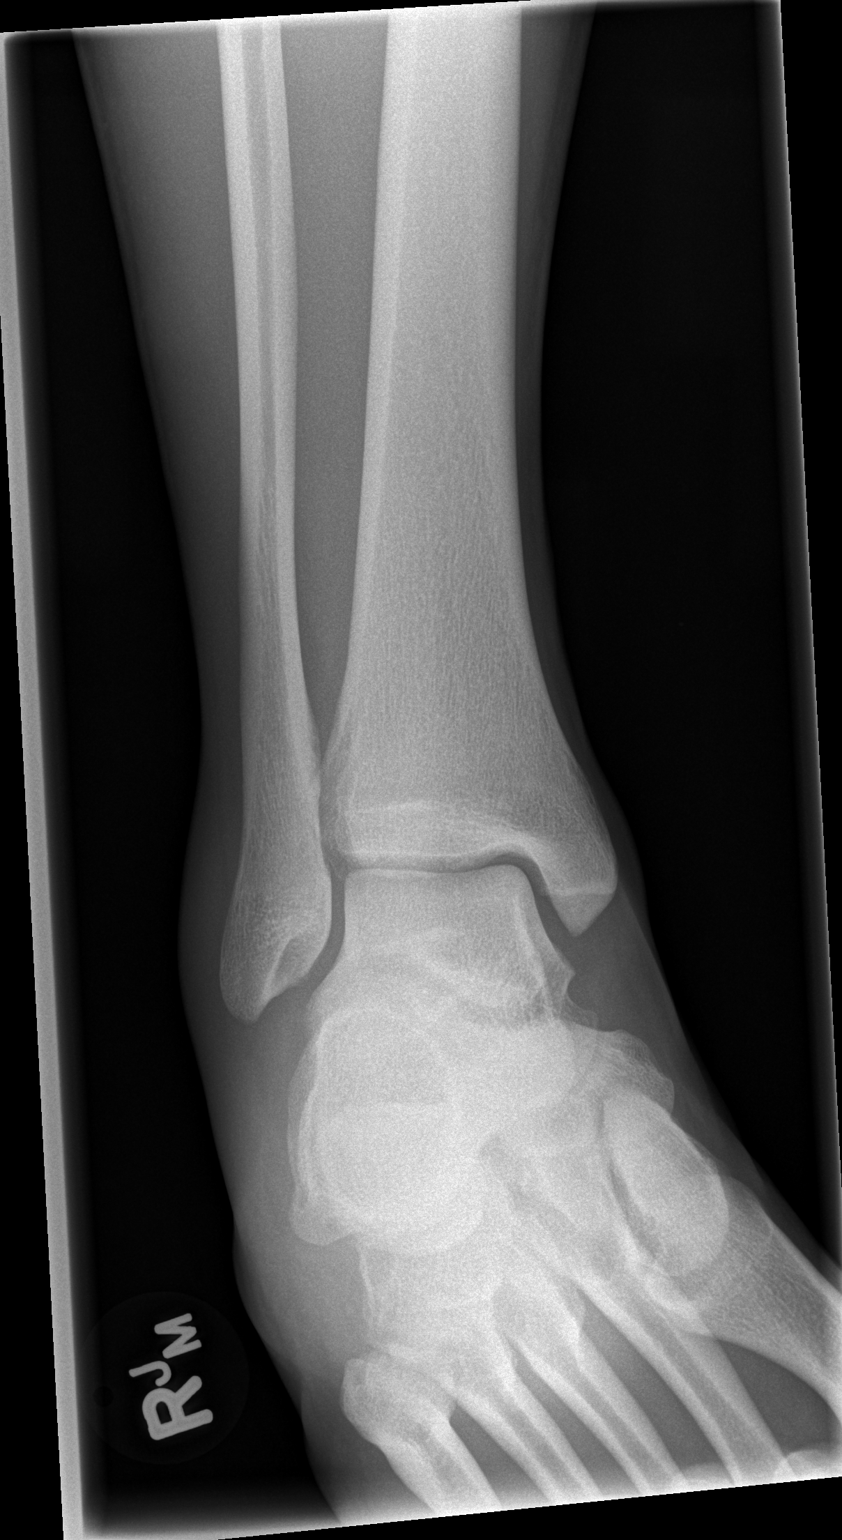

[t ankle joint lat right]
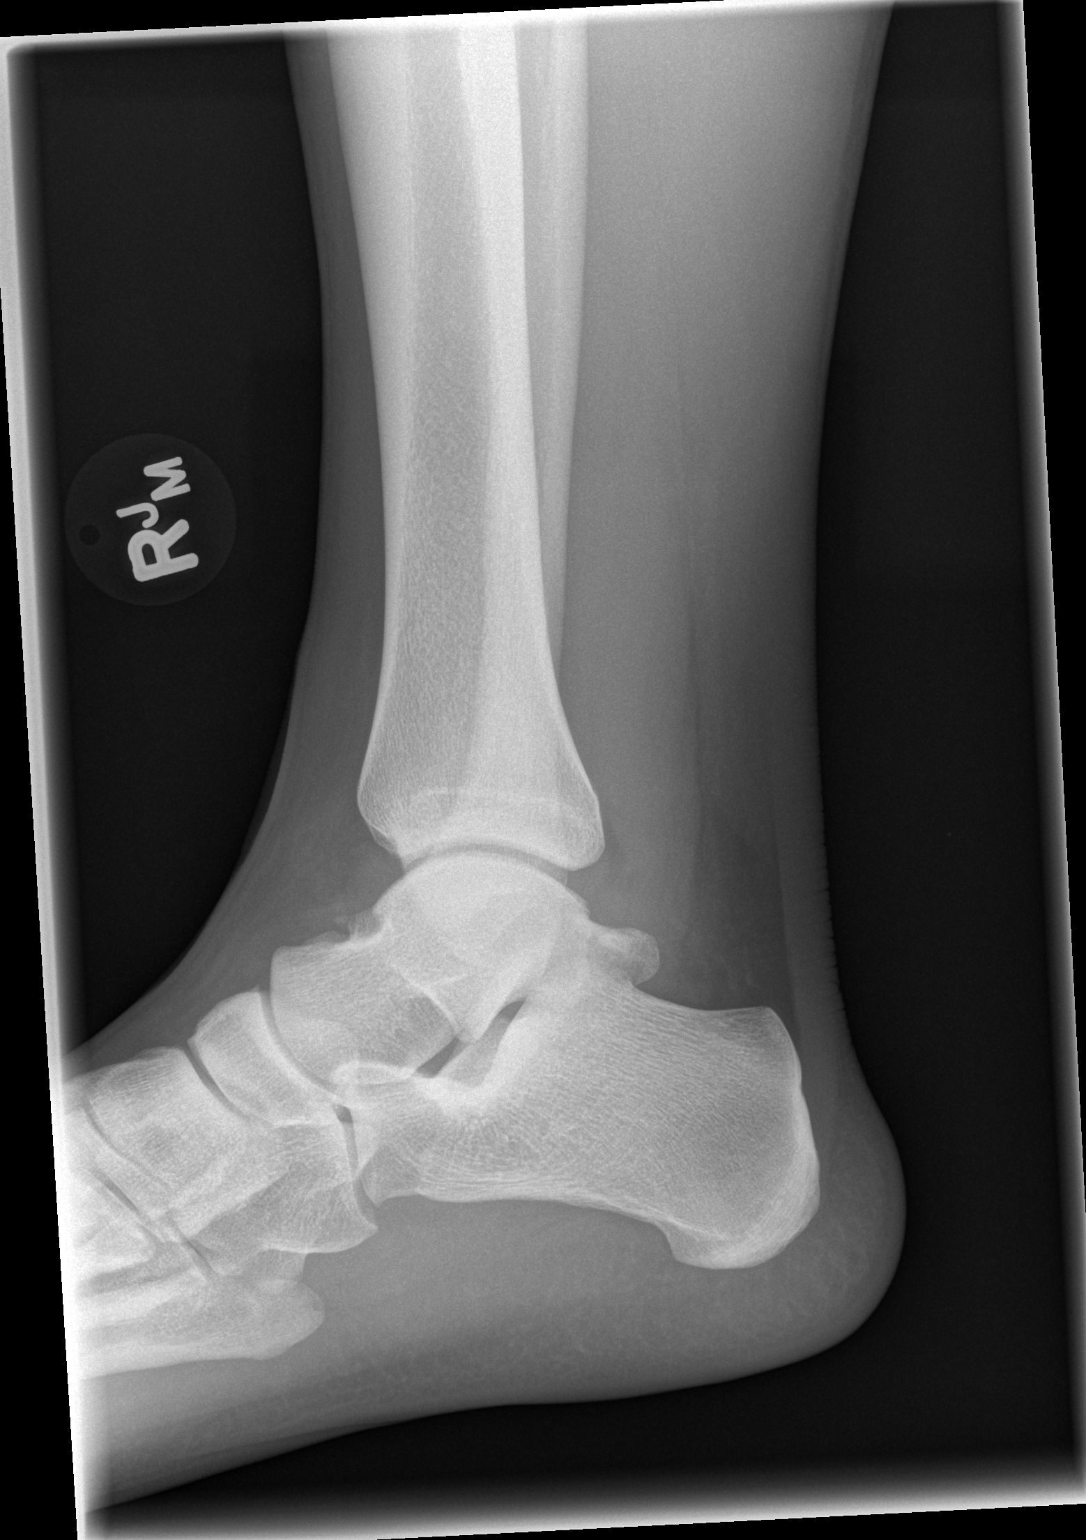

[3 of 3 positions shown; findings below may reference images not displayed]

FINDINGS: Mild lateral soft tissue swelling. No acute bony abnormality.
Specifically, no fracture, subluxation, or dislocation.
IMPRESSION: No bony abnormality.

## 2020-12-09 IMAGING — DX DG KNEE COMPLETE 4+V*R*
4 series · 4 of 4 positions shown · non-contrast
Comparison: None.

CLINICAL DATA: Medial right knee pain following a twisting injury 2
days ago.

EXAM:
RIGHT KNEE - COMPLETE 4+ VIEW

[knee ap]
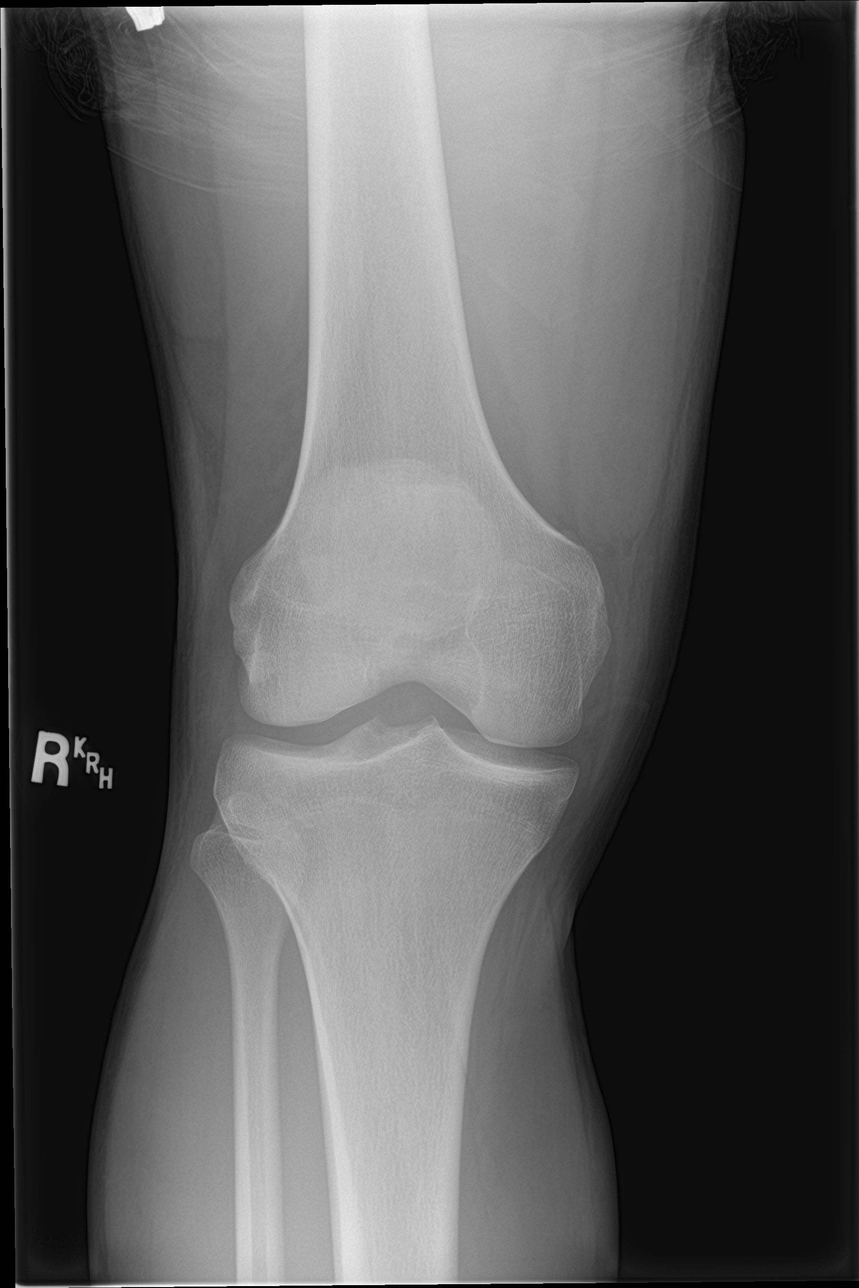

[knee lat]
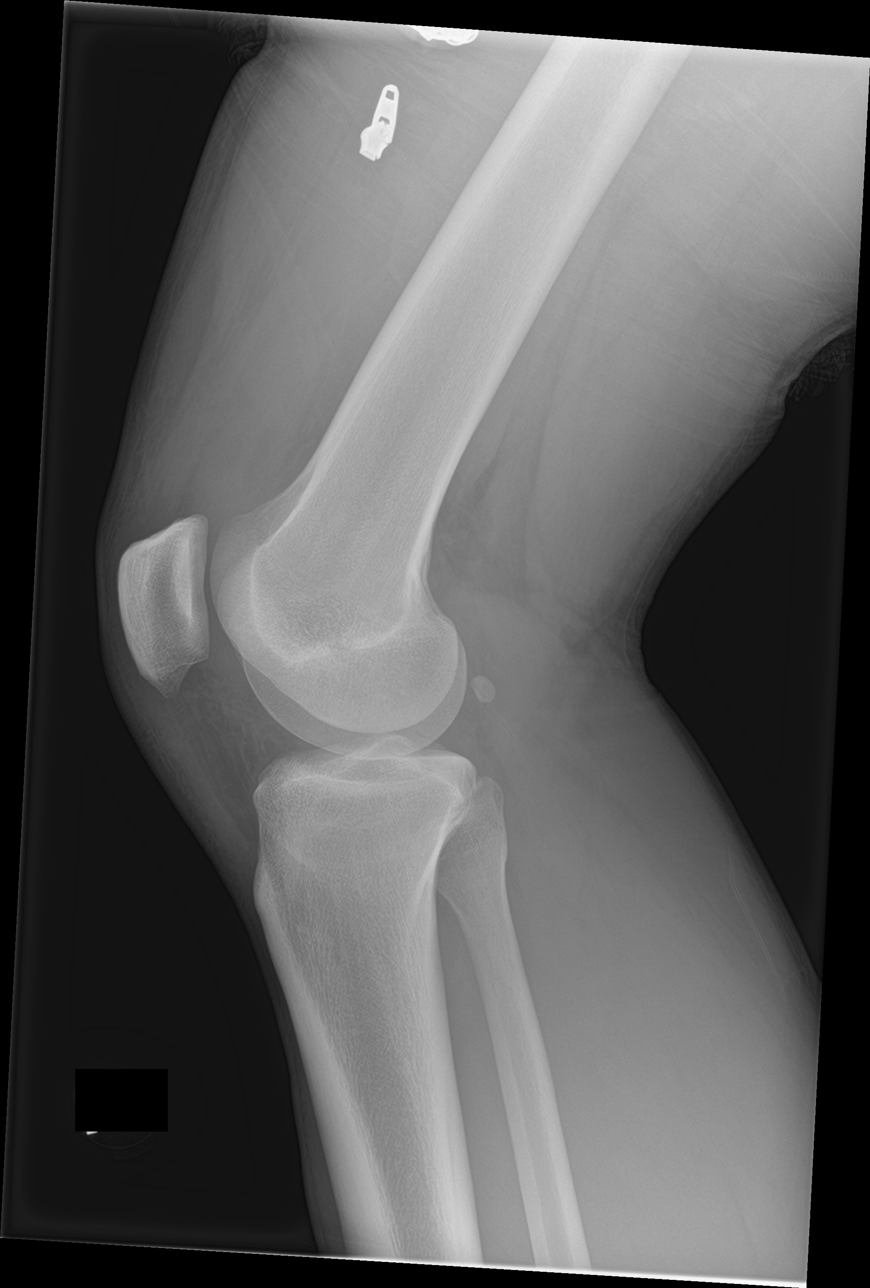

[knee obl (1 of 2)]
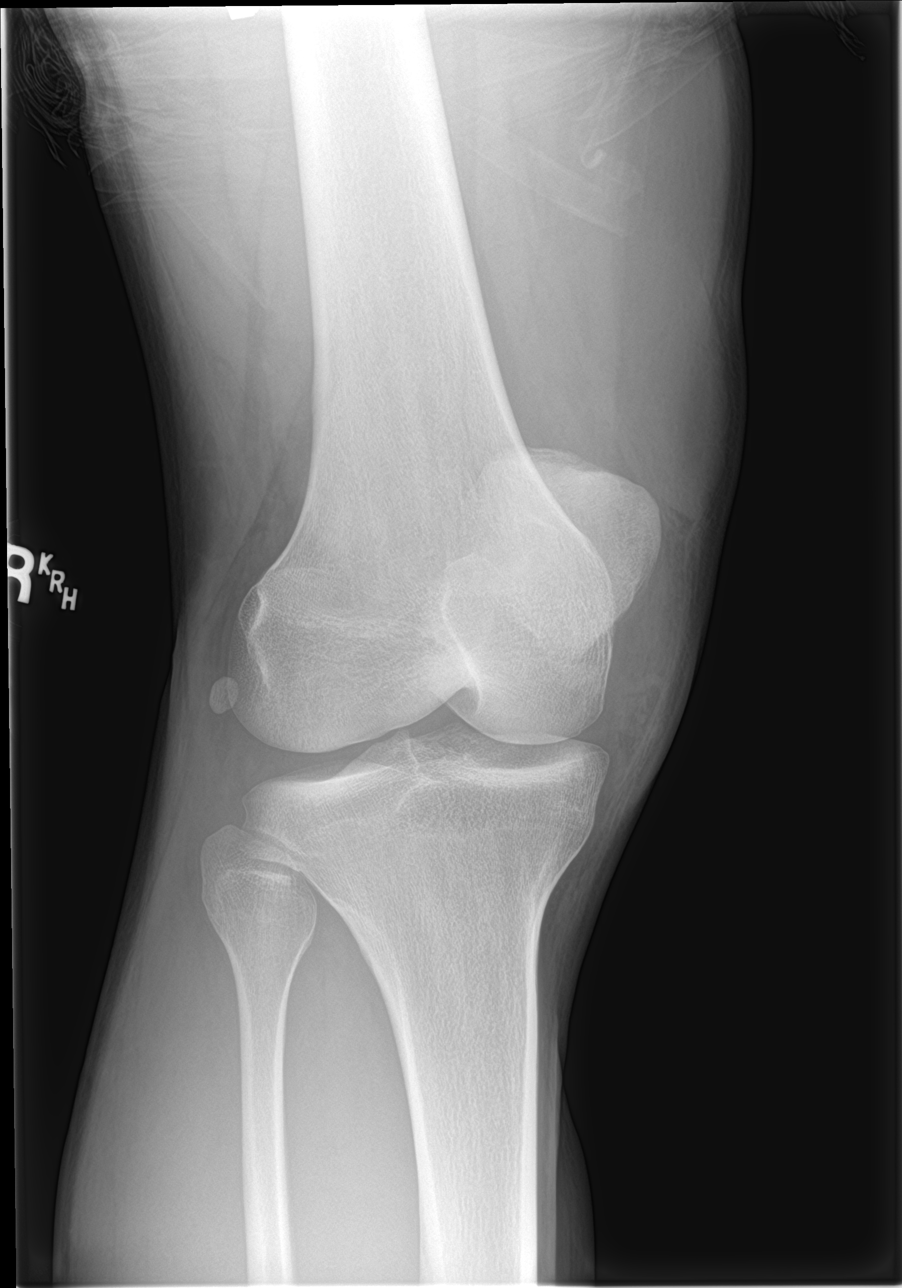

[knee obl (2 of 2)]
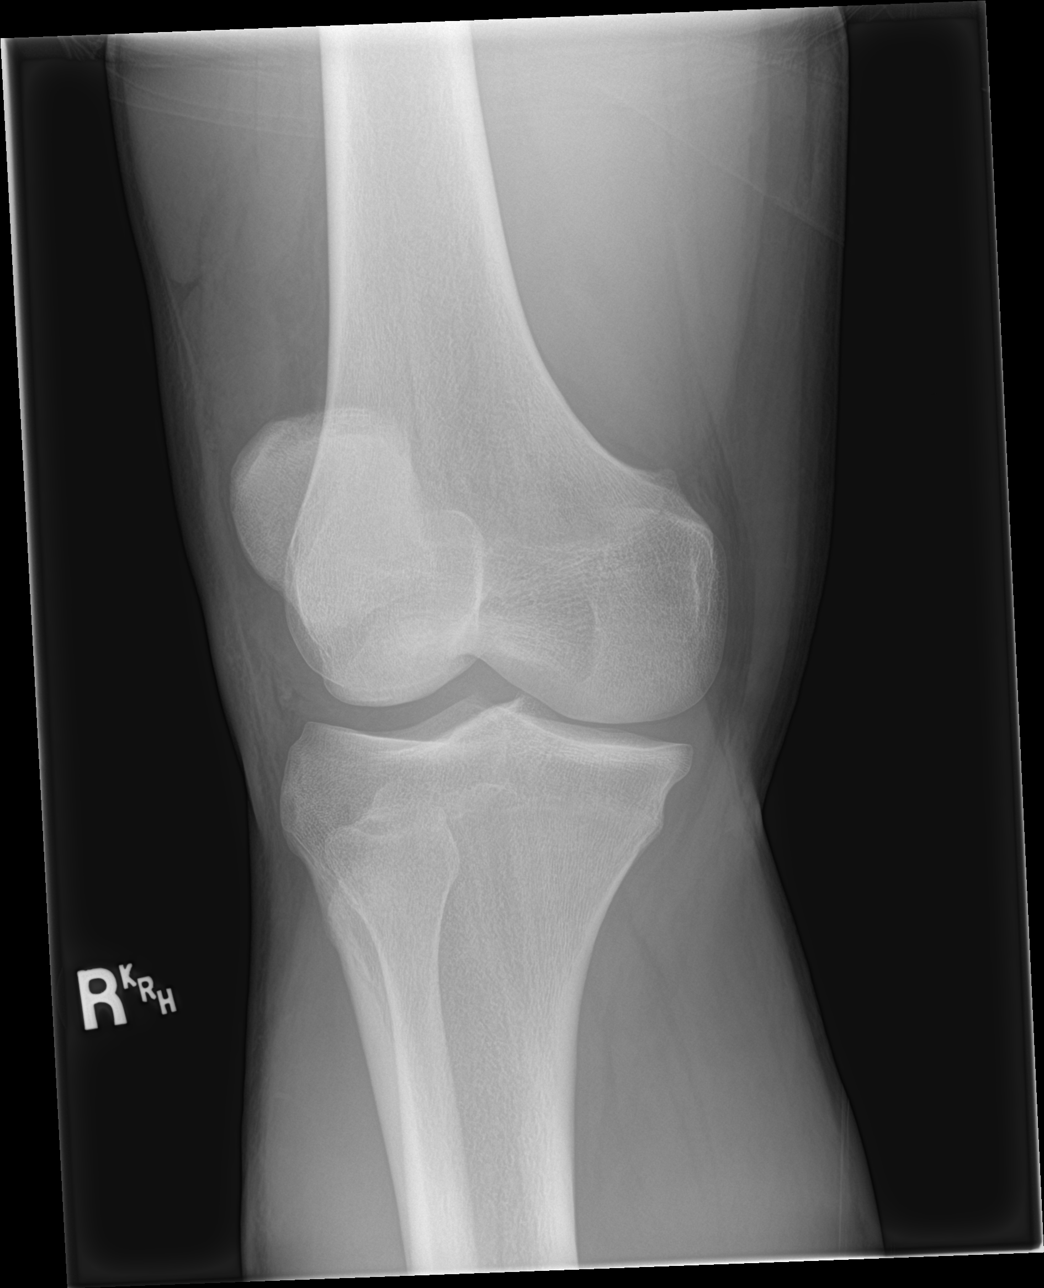

[4 of 4 positions shown; findings below may reference images not displayed]

FINDINGS: Moderate-sized effusion. Normal appearing bones with no fracture or
dislocation seen.
IMPRESSION: Moderate-sized effusion.  No fracture or dislocation seen.
